# Patient Record
Sex: Male | Born: 1965 | Race: White | Hispanic: No | Marital: Married | Smoking: Never smoker
Health system: Southern US, Community
[De-identification: ages and names within clinical notes are randomized; demographics above are authoritative.]

## PROBLEM LIST (undated history)

## (undated) DIAGNOSIS — E119 Type 2 diabetes mellitus without complications: Secondary | ICD-10-CM

---

## 2016-02-09 DIAGNOSIS — R079 Chest pain, unspecified: Secondary | ICD-10-CM | POA: Diagnosis not present

## 2016-02-09 DIAGNOSIS — I10 Essential (primary) hypertension: Secondary | ICD-10-CM | POA: Diagnosis not present

## 2016-02-09 DIAGNOSIS — I471 Supraventricular tachycardia: Secondary | ICD-10-CM | POA: Diagnosis not present

## 2016-02-12 DIAGNOSIS — R079 Chest pain, unspecified: Secondary | ICD-10-CM | POA: Diagnosis not present

## 2016-02-12 DIAGNOSIS — I480 Paroxysmal atrial fibrillation: Secondary | ICD-10-CM | POA: Diagnosis not present

## 2016-02-12 DIAGNOSIS — E099 Drug or chemical induced diabetes mellitus without complications: Secondary | ICD-10-CM | POA: Diagnosis not present

## 2016-02-12 DIAGNOSIS — T50905A Adverse effect of unspecified drugs, medicaments and biological substances, initial encounter: Secondary | ICD-10-CM | POA: Diagnosis not present

## 2016-02-20 ENCOUNTER — Emergency Department (HOSPITAL_COMMUNITY): Payer: BLUE CROSS/BLUE SHIELD

## 2016-02-20 ENCOUNTER — Encounter (HOSPITAL_COMMUNITY): Payer: Self-pay | Admitting: *Deleted

## 2016-02-20 ENCOUNTER — Emergency Department (HOSPITAL_COMMUNITY)
Admission: EM | Admit: 2016-02-20 | Discharge: 2016-02-20 | Disposition: A | Discharge status: Home / Self Care | Payer: BLUE CROSS/BLUE SHIELD | Attending: Emergency Medicine | Admitting: Emergency Medicine

## 2016-02-20 DIAGNOSIS — R079 Chest pain, unspecified: Secondary | ICD-10-CM | POA: Insufficient documentation

## 2016-02-20 DIAGNOSIS — Z7984 Long term (current) use of oral hypoglycemic drugs: Secondary | ICD-10-CM | POA: Insufficient documentation

## 2016-02-20 DIAGNOSIS — R0602 Shortness of breath: Secondary | ICD-10-CM | POA: Diagnosis not present

## 2016-02-20 DIAGNOSIS — R5381 Other malaise: Secondary | ICD-10-CM | POA: Insufficient documentation

## 2016-02-20 DIAGNOSIS — E119 Type 2 diabetes mellitus without complications: Secondary | ICD-10-CM | POA: Diagnosis not present

## 2016-02-20 DIAGNOSIS — R0789 Other chest pain: Secondary | ICD-10-CM | POA: Diagnosis not present

## 2016-02-20 HISTORY — DX: Type 2 diabetes mellitus without complications: E11.9

## 2016-02-20 LAB — CBC
HCT: 41.6 % (ref 39.0–52.0)
Hemoglobin: 14.2 g/dL (ref 13.0–17.0)
MCH: 29 pg (ref 26.0–34.0)
MCHC: 34.1 g/dL (ref 30.0–36.0)
MCV: 84.9 fL (ref 78.0–100.0)
Platelets: 162 10*3/uL (ref 150–400)
RBC: 4.9 MIL/uL (ref 4.22–5.81)
RDW: 12.5 % (ref 11.5–15.5)
WBC: 5.7 10*3/uL (ref 4.0–10.5)

## 2016-02-20 LAB — I-STAT TROPONIN, ED
Troponin i, poc: 0 ng/mL (ref 0.00–0.08)
Troponin i, poc: 0 ng/mL (ref 0.00–0.08)

## 2016-02-20 LAB — BASIC METABOLIC PANEL
Anion gap: 9 (ref 5–15)
BUN: 19 mg/dL (ref 6–20)
CO2: 25 mmol/L (ref 22–32)
Calcium: 9.3 mg/dL (ref 8.9–10.3)
Chloride: 104 mmol/L (ref 101–111)
Creatinine, Ser: 1 mg/dL (ref 0.61–1.24)
GFR calc Af Amer: >60 mL/min (ref >60)
GFR calc non Af Amer: >60 mL/min (ref >60)
Glucose, Bld: 232 mg/dL — ABNORMAL HIGH (ref 65–99)
Potassium: 4.3 mmol/L (ref 3.5–5.1)
Sodium: 138 mmol/L (ref 135–145)

## 2016-02-20 LAB — CBG MONITORING, ED: Glucose-Capillary: 212 mg/dL — ABNORMAL HIGH (ref 65–99)

## 2016-02-20 NOTE — ED Notes (Signed)
CBG 212 

## 2016-02-20 NOTE — Discharge Instructions (Signed)
Continue all your regular medications. Please follow-up with your doctor for stress test as scheduled. Return if worsening.   Nonspecific Chest Pain  Chest pain can be caused by many different conditions. There is always a chance that your pain could be related to something serious, such as a heart attack or a blood clot in your lungs. Chest pain can also be caused by conditions that are not life-threatening. If you have chest pain, it is very important to follow up with your health care provider. CAUSES  Chest pain can be caused by:  Heartburn.  Pneumonia or bronchitis.  Anxiety or stress.  Inflammation around your heart (pericarditis) or lung (pleuritis or pleurisy).  A blood clot in your lung.  A collapsed lung (pneumothorax). It can develop suddenly on its own (spontaneous pneumothorax) or from trauma to the chest.  Shingles infection (varicella-zoster virus).  Heart attack.  Damage to the bones, muscles, and cartilage that make up your chest wall. This can include:  Bruised bones due to injury.  Strained muscles or cartilage due to frequent or repeated coughing or overwork.  Fracture to one or more ribs.  Sore cartilage due to inflammation (costochondritis). RISK FACTORS  Risk factors for chest pain may include:  Activities that increase your risk for trauma or injury to your chest.  Respiratory infections or conditions that cause frequent coughing.  Medical conditions or overeating that can cause heartburn.  Heart disease or family history of heart disease.  Conditions or health behaviors that increase your risk of developing a blood clot.  Having had chicken pox (varicella zoster). SIGNS AND SYMPTOMS Chest pain can feel like:  Burning or tingling on the surface of your chest or deep in your chest.  Crushing, pressure, aching, or squeezing pain.  Dull or sharp pain that is worse when you move, cough, or take a deep breath.  Pain that is also felt in your  back, neck, shoulder, or arm, or pain that spreads to any of these areas. Your chest pain may come and go, or it may stay constant. DIAGNOSIS Lab tests or other studies may be needed to find the cause of your pain. Your health care provider may have you take a test called an ambulatory ECG (electrocardiogram). An ECG records your heartbeat patterns at the time the test is performed. You may also have other tests, such as:  Transthoracic echocardiogram (TTE). During echocardiography, sound waves are used to create a picture of all of the heart structures and to look at how blood flows through your heart.  Transesophageal echocardiogram (TEE).This is a more advanced imaging test that obtains images from inside your body. It allows your health care provider to see your heart in finer detail.  Cardiac monitoring. This allows your health care provider to monitor your heart rate and rhythm in real time.  Holter monitor. This is a portable device that records your heartbeat and can help to diagnose abnormal heartbeats. It allows your health care provider to track your heart activity for several days, if needed.  Stress tests. These can be done through exercise or by taking medicine that makes your heart beat more quickly.  Blood tests.  Imaging tests. TREATMENT  Your treatment depends on what is causing your chest pain. Treatment may include:  Medicines. These may include:  Acid blockers for heartburn.  Anti-inflammatory medicine.  Pain medicine for inflammatory conditions.  Antibiotic medicine, if an infection is present.  Medicines to dissolve blood clots.  Medicines to treat coronary  artery disease.  Supportive care for conditions that do not require medicines. This may include:  Resting.  Applying heat or cold packs to injured areas.  Limiting activities until pain decreases. HOME CARE INSTRUCTIONS  If you were prescribed an antibiotic medicine, finish it all even if you  start to feel better.  Avoid any activities that bring on chest pain.  Do not use any tobacco products, including cigarettes, chewing tobacco, or electronic cigarettes. If you need help quitting, ask your health care provider.  Do not drink alcohol.  Take medicines only as directed by your health care provider.  Keep all follow-up visits as directed by your health care provider. This is important. This includes any further testing if your chest pain does not go away.  If heartburn is the cause for your chest pain, you may be told to keep your head raised (elevated) while sleeping. This reduces the chance that acid will go from your stomach into your esophagus.  Make lifestyle changes as directed by your health care provider. These may include:  Getting regular exercise. Ask your health care provider to suggest some activities that are safe for you.  Eating a heart-healthy diet. A registered dietitian can help you to learn healthy eating options.  Maintaining a healthy weight.  Managing diabetes, if necessary.  Reducing stress. SEEK MEDICAL CARE IF:  Your chest pain does not go away after treatment.  You have a rash with blisters on your chest.  You have a fever. SEEK IMMEDIATE MEDICAL CARE IF:   Your chest pain is worse.  You have an increasing cough, or you cough up blood.  You have severe abdominal pain.  You have severe weakness.  You faint.  You have chills.  You have sudden, unexplained chest discomfort.  You have sudden, unexplained discomfort in your arms, back, neck, or jaw.  You have shortness of breath at any time.  You suddenly start to sweat, or your skin gets clammy.  You feel nauseous or you vomit.  You suddenly feel light-headed or dizzy.  Your heart begins to beat quickly, or it feels like it is skipping beats. These symptoms may represent a serious problem that is an emergency. Do not wait to see if the symptoms will go away. Get medical  help right away. Call your local emergency services (911 in the [place] Do not drive yourself to the hospital.   This information is not intended to replace advice given to you by your health care provider. Make sure you discuss any questions you have with your health care provider.   Document Released: 08/04/2005 Document Revised: 11/15/2014 Document Reviewed: 05/31/2014 Elsevier Interactive Patient Education [employer].

## 2016-02-20 NOTE — ED Provider Notes (Signed)
CSN: [id number]     Arrival date & time 02/20/16  1405 History   First MD Initiated Contact with Patient 02/20/16 1507     Chief Complaint  Patient presents with  . Chest Pain     (Consider location/radiation/quality/duration/timing/severity/associated sxs/prior Treatment) HPI Kenneth Lynn is a 50 y.o. male with history of diabetes, recently treated A. fib, presents to emergency department complaining of chest tightness and generalized malaise. Patient states that he was diagnosed with diabetes approximately 6 months ago, currently on metformin. He states that about 3 weeks ago he was treated at [facility] for A. fib, states he was converted to sinus rhythm with medications and was discharged home with metoprolol and aspirin 321 mg. He states since his discharge he has been not feeling well. He reports constant chest discomfort that is worse at times. He states it is independent of exertion or eating. It is not associated with different body positions or laying down flat. He denies associated shortness of breath or dizziness. He does state that he has had some night sweats. He states that his discomfort is actually better with working and moving around. He did not take his metformin yesterday and this morning, thinking that maybe that is what is causing his symptoms. He went to work, was not feeling well there and decided to come here. Patient has stress test scheduled in 4 days.  Past Medical History  Diagnosis Date  . Diabetes mellitus without complication (HCC)    History reviewed. No pertinent past surgical history. History reviewed. No pertinent family history. Social History  Substance Use Topics  . Smoking status: Never Smoker   . Smokeless tobacco: None  . Alcohol Use: No    Review of Systems  Constitutional: Negative for fever and chills.  Respiratory: Positive for chest tightness. Negative for cough and shortness of breath.   Cardiovascular: Positive for chest pain.  Negative for palpitations and leg swelling.  Gastrointestinal: Negative for nausea, vomiting, abdominal pain, diarrhea and abdominal distention.  Genitourinary: Negative for urgency.  Musculoskeletal: Negative for myalgias, arthralgias, neck pain and neck stiffness.  Skin: Negative for rash.  Allergic/Immunologic: Negative for immunocompromised state.  Neurological: Negative for dizziness, weakness, light-headedness, numbness and headaches.  All other systems reviewed and are negative.     Allergies  Review of patient's allergies indicates no known allergies.  Home Medications   Prior to Admission medications   Not on File   BP 163/95 mmHg  Pulse 91  Temp(Src) 98.2 F (36.8 C) (Oral)  Resp 17  SpO2 98% Physical Exam  Constitutional: He is oriented to person, place, and time. He appears well-developed and well-nourished. No distress.  HENT:  Head: Normocephalic and atraumatic.  Eyes: Conjunctivae are normal.  Neck: Neck supple.  Cardiovascular: Normal rate, regular rhythm and normal heart sounds.   Pulmonary/Chest: Effort normal. No respiratory distress. He has no wheezes. He has no rales.  Abdominal: Soft. Bowel sounds are normal. He exhibits no distension. There is no tenderness. There is no rebound.  Musculoskeletal: He exhibits no edema.  Neurological: He is alert and oriented to person, place, and time.  Skin: Skin is warm and dry.  Nursing note and vitals reviewed.   ED Course  Procedures (including critical care time) Labs Review Labs Reviewed  BASIC METABOLIC PANEL - Abnormal; Notable for the following:    Glucose, Bld 232 (*)    All other components within normal limits  CBG MONITORING, ED - Abnormal; Notable for the following:  Glucose-Capillary 212 (*)    All other components within normal limits  CBC  I-STAT TROPOININ, ED  I-STAT TROPOININ, ED    Imaging Review Dg Chest 2 View  02/20/2016  CLINICAL DATA:  Mid chest heaviness with diaphoresis and  shortness of breast since last night, history diabetes mellitus, high blood pressure, atrial fibrillation, initial encounter EXAM: CHEST  2 VIEW COMPARISON:  None FINDINGS: Normal heart size, mediastinal contours, and pulmonary vascularity. Lungs clear. No pleural effusion or pneumothorax. Bones unremarkable. IMPRESSION: No acute abnormalities. Electronically Signed   By: Ulyses SouthwardMark  Boles M.D.   On: 02/20/2016 14:47   I have personally reviewed and evaluated these images and lab results as part of my medical decision-making.   EKG Interpretation None      MDM   Final diagnoses:  Chest pain, unspecified chest pain type    Pt is a non toxic appearing [age] male here with constant chest pain for 2 weeks and generalized malaise. He is mildly hypertensive here, no hx of htn. He did not take his metoprolol today. Pt's ECG with no ischemic changes. Initial trop negative. His pain is atypical. Discussed with Dr. Effie ShyWentz. Pt's heart score is 2. Risk factors include family hx of heart attack and diabetes. Pt is considered to be low risk. Will get delta trop. Will monitor mean while.    Delta troponin negative. Patient remains in sinus rhythm. Normal vital signs. He is low risk for coronary disease, and appropriate for outpatient treatment. He has stress test scheduled in 4 days. I had a long discussion with patient, he states he had a very stressful week at work, he reports some anxiety symptoms as well. He also is very concerned about side effects of his medications. I instructed him to talk to his doctor about his medications and see if they want to switch to something else. Otherwise proceed with stress test. Return precautions discussed.  Filed Vitals:   02/20/16 1915 02/20/16 1930 02/20/16 1945 02/20/16 2000  BP: 143/91 134/85 128/83 136/82  Pulse: 72 76 80 78  Temp:      TempSrc:      Resp: 16 17 17 14   SpO2: 96% 98% 96% 98%     Jaynie Crumbleatyana Darnelle Derrick, PA-C 02/20/16 2349  Mancel BaleElliott Wentz, MD 02/21/16  1436

## 2016-02-20 NOTE — ED Notes (Signed)
Pt departed in NAD.  

## 2016-02-20 NOTE — ED Notes (Signed)
Pt reports onset of chest heaviness last night with diaphoresis and mild sob. Recent medication changes and reports being at [facility] on 3/23 and diagnosed with afib.

## 2016-02-25 DIAGNOSIS — R079 Chest pain, unspecified: Secondary | ICD-10-CM | POA: Diagnosis not present

## 2016-02-25 DIAGNOSIS — T50905A Adverse effect of unspecified drugs, medicaments and biological substances, initial encounter: Secondary | ICD-10-CM | POA: Diagnosis not present

## 2016-02-25 DIAGNOSIS — E099 Drug or chemical induced diabetes mellitus without complications: Secondary | ICD-10-CM | POA: Diagnosis not present

## 2016-02-25 DIAGNOSIS — I480 Paroxysmal atrial fibrillation: Secondary | ICD-10-CM | POA: Diagnosis not present

## 2016-04-19 DIAGNOSIS — E1165 Type 2 diabetes mellitus with hyperglycemia: Secondary | ICD-10-CM | POA: Diagnosis not present

## 2016-04-29 DIAGNOSIS — F419 Anxiety disorder, unspecified: Secondary | ICD-10-CM | POA: Diagnosis not present

## 2016-04-29 DIAGNOSIS — E1165 Type 2 diabetes mellitus with hyperglycemia: Secondary | ICD-10-CM | POA: Diagnosis not present

## 2016-04-29 DIAGNOSIS — R0789 Other chest pain: Secondary | ICD-10-CM | POA: Diagnosis not present

## 2016-05-27 DIAGNOSIS — R079 Chest pain, unspecified: Secondary | ICD-10-CM | POA: Diagnosis not present

## 2016-05-27 DIAGNOSIS — I480 Paroxysmal atrial fibrillation: Secondary | ICD-10-CM | POA: Diagnosis not present

## 2016-05-27 DIAGNOSIS — R002 Palpitations: Secondary | ICD-10-CM | POA: Diagnosis not present

## 2016-05-27 DIAGNOSIS — R0789 Other chest pain: Secondary | ICD-10-CM | POA: Diagnosis not present

## 2016-05-27 DIAGNOSIS — E119 Type 2 diabetes mellitus without complications: Secondary | ICD-10-CM | POA: Diagnosis not present

## 2016-05-28 DIAGNOSIS — R0789 Other chest pain: Secondary | ICD-10-CM | POA: Diagnosis not present

## 2016-05-28 DIAGNOSIS — I480 Paroxysmal atrial fibrillation: Secondary | ICD-10-CM | POA: Diagnosis not present

## 2016-05-28 DIAGNOSIS — R079 Chest pain, unspecified: Secondary | ICD-10-CM | POA: Diagnosis not present

## 2016-06-21 DIAGNOSIS — M545 Low back pain: Secondary | ICD-10-CM | POA: Diagnosis not present

## 2016-06-23 DIAGNOSIS — I10 Essential (primary) hypertension: Secondary | ICD-10-CM | POA: Diagnosis not present

## 2016-06-23 DIAGNOSIS — E119 Type 2 diabetes mellitus without complications: Secondary | ICD-10-CM | POA: Diagnosis not present

## 2016-06-23 DIAGNOSIS — I482 Chronic atrial fibrillation: Secondary | ICD-10-CM | POA: Diagnosis not present

## 2016-06-23 DIAGNOSIS — E782 Mixed hyperlipidemia: Secondary | ICD-10-CM | POA: Diagnosis not present

## 2016-07-01 DIAGNOSIS — E668 Other obesity: Secondary | ICD-10-CM | POA: Diagnosis not present

## 2016-07-01 DIAGNOSIS — E119 Type 2 diabetes mellitus without complications: Secondary | ICD-10-CM | POA: Diagnosis not present

## 2016-07-01 DIAGNOSIS — I480 Paroxysmal atrial fibrillation: Secondary | ICD-10-CM | POA: Diagnosis not present

## 2016-07-13 DIAGNOSIS — E119 Type 2 diabetes mellitus without complications: Secondary | ICD-10-CM | POA: Diagnosis not present

## 2016-07-26 ENCOUNTER — Ambulatory Visit (HOSPITAL_BASED_OUTPATIENT_CLINIC_OR_DEPARTMENT_OTHER): Payer: BLUE CROSS/BLUE SHIELD | Attending: Cardiology | Admitting: Internal Medicine

## 2016-07-26 VITALS — Ht 70.0 in | Wt 240.0 lb

## 2016-07-26 DIAGNOSIS — R0683 Snoring: Secondary | ICD-10-CM | POA: Diagnosis not present

## 2016-07-26 DIAGNOSIS — E669 Obesity, unspecified: Secondary | ICD-10-CM | POA: Diagnosis not present

## 2016-07-26 DIAGNOSIS — G4733 Obstructive sleep apnea (adult) (pediatric): Secondary | ICD-10-CM

## 2016-07-26 DIAGNOSIS — I4891 Unspecified atrial fibrillation: Secondary | ICD-10-CM | POA: Diagnosis not present

## 2016-07-26 DIAGNOSIS — Z6834 Body mass index (BMI) 34.0-34.9, adult: Secondary | ICD-10-CM | POA: Diagnosis not present

## 2016-07-26 DIAGNOSIS — E119 Type 2 diabetes mellitus without complications: Secondary | ICD-10-CM | POA: Diagnosis not present

## 2016-07-27 ENCOUNTER — Other Ambulatory Visit (HOSPITAL_BASED_OUTPATIENT_CLINIC_OR_DEPARTMENT_OTHER): Payer: Self-pay

## 2016-07-27 DIAGNOSIS — G4733 Obstructive sleep apnea (adult) (pediatric): Secondary | ICD-10-CM

## 2016-08-07 DIAGNOSIS — G4733 Obstructive sleep apnea (adult) (pediatric): Secondary | ICD-10-CM | POA: Diagnosis not present

## 2016-08-07 NOTE — Procedures (Signed)
   Patient Name: Kenneth Lynn, Kenneth Lynn Study Date: 07/26/2016 Gender: Male D.O.B: 1966/05/22 Age (years): 50 Referring Provider: Ellwood HandlerWilliam Tilley Height (inches): 70 Interpreting Physician: Kenneth Duhamellinton Symphani Eckstrom MD, ABSM Weight (lbs): 240 RPSGT: Kenneth Lynn, Kenneth Lynn BMI: 34 MRN: [id number] Neck Size: 17.50 CLINICAL INFORMATION Sleep Study Type: NPSG Indication for sleep study: Obesity Epworth Sleepiness Score: 5 SLEEP STUDY TECHNIQUE As per the AASM Manual for the Scoring of Sleep and Associated Events v2.3 (April 2016) with a hypopnea requiring 4% desaturations. The channels recorded and monitored were frontal, central and occipital EEG, electrooculogram (EOG), submentalis EMG (chin), nasal and oral airflow, thoracic and abdominal wall motion, anterior tibialis EMG, snore microphone, electrocardiogram, and pulse oximetry.  MEDICATIONS Patient's medications include: charted for review. Medications self-administered by patient during sleep study : No sleep medicine administered.  SLEEP ARCHITECTURE The study was initiated at 10:09:59 PM and ended at 4:15:46 AM. Sleep onset time was 42.7 minutes and the sleep efficiency was 57.0%. The total sleep time was 208.5 minutes. Stage REM latency was 231.5 minutes. The patient spent 11.51% of the night in stage N1 sleep, 82.48% in stage N2 sleep, 0.00% in stage N3 and 6.01% in REM. Alpha intrusion was absent. Supine sleep was 0.00%. Wake after sleep onset 114 minutes  RESPIRATORY PARAMETERS The overall apnea/hypopnea index (AHI) was 18.4 per hour. There were 63 total apneas, including 59 obstructive, 3 central and 1 mixed apneas. There were 1 hypopneas and 16 RERAs. The AHI during Stage REM sleep was 14.4 per hour. AHI while supine was N/A per hour. The mean oxygen saturation was 93.29%. The minimum SpO2 during sleep was 87.00%. Loud snoring was noted during this study.  CARDIAC DATA The 2 lead EKG demonstrated sinus rhythm. The mean heart rate was 64.18  beats per minute. Other EKG findings include: None.  LEG MOVEMENT DATA The total PLMS were 0 with a resulting PLMS index of 0.00. Associated arousal with leg movement index was 0.0 .  IMPRESSIONS - Moderate obstructive sleep apnea occurred during this study (AHI = 18.4/h). - Difficulty initiating and maintaining sleep, with insufficient early sleep to meet protocol requirements for split CPAP titration. - No significant central sleep apnea occurred during this study (CAI = 0.9/h). - Mild oxygen desaturation was noted during this study (Min O2 = 87.00%). - The patient snored with Loud snoring volume. - No cardiac abnormalities were noted during this study. - Clinically significant periodic limb movements did not occur during sleep. No significant associated arousals.  DIAGNOSIS - Obstructive Sleep Apnea (327.23 [G47.33 ICD-10]  RECOMMENDATIONS - Therapeutic CPAP titration to determine optimal pressure required to alleviate sleep disordered breathing. - Avoid alcohol, sedatives and other CNS depressants that may worsen sleep apnea and disrupt normal sleep architecture. - Sleep hygiene should be reviewed to assess factors that may improve sleep quality. - Weight management and regular exercise should be initiated or continued if appropriate.  [Electronically signed] 08/07/2016 09:01 AM  Kenneth Duhamellinton Kenneth Bonus MD, ABSM Diplomate, American Board of Sleep Medicine   NPI: [id number]  Waymon BudgeYOUNG,Jennavie Martinek D Diplomate, American Board of Sleep Medicine  ELECTRONICALLY SIGNED ON:  08/07/2016, 8:55 AM [facility] PH: [phone]   FX: [phone] ACCREDITED BY THE AMERICAN ACADEMY OF SLEEP MEDICINE

## 2016-08-12 DIAGNOSIS — Z23 Encounter for immunization: Secondary | ICD-10-CM | POA: Diagnosis not present

## 2016-09-01 DIAGNOSIS — E119 Type 2 diabetes mellitus without complications: Secondary | ICD-10-CM | POA: Diagnosis not present

## 2016-10-05 DIAGNOSIS — R1013 Epigastric pain: Secondary | ICD-10-CM | POA: Diagnosis not present

## 2016-10-05 DIAGNOSIS — R109 Unspecified abdominal pain: Secondary | ICD-10-CM | POA: Diagnosis not present

## 2016-10-19 DIAGNOSIS — I4891 Unspecified atrial fibrillation: Secondary | ICD-10-CM | POA: Diagnosis not present

## 2016-10-19 DIAGNOSIS — E119 Type 2 diabetes mellitus without complications: Secondary | ICD-10-CM | POA: Diagnosis not present

## 2016-10-19 DIAGNOSIS — Z1389 Encounter for screening for other disorder: Secondary | ICD-10-CM | POA: Diagnosis not present

## 2016-10-25 IMAGING — DX DG CHEST 2V
2 series · 2 of 2 positions shown · non-contrast
Comparison: None

CLINICAL DATA: Mid chest heaviness with diaphoresis and shortness
of breast since last night, history diabetes mellitus, high blood
pressure, atrial fibrillation, initial encounter

EXAM:
CHEST  2 VIEW

[chest pa]
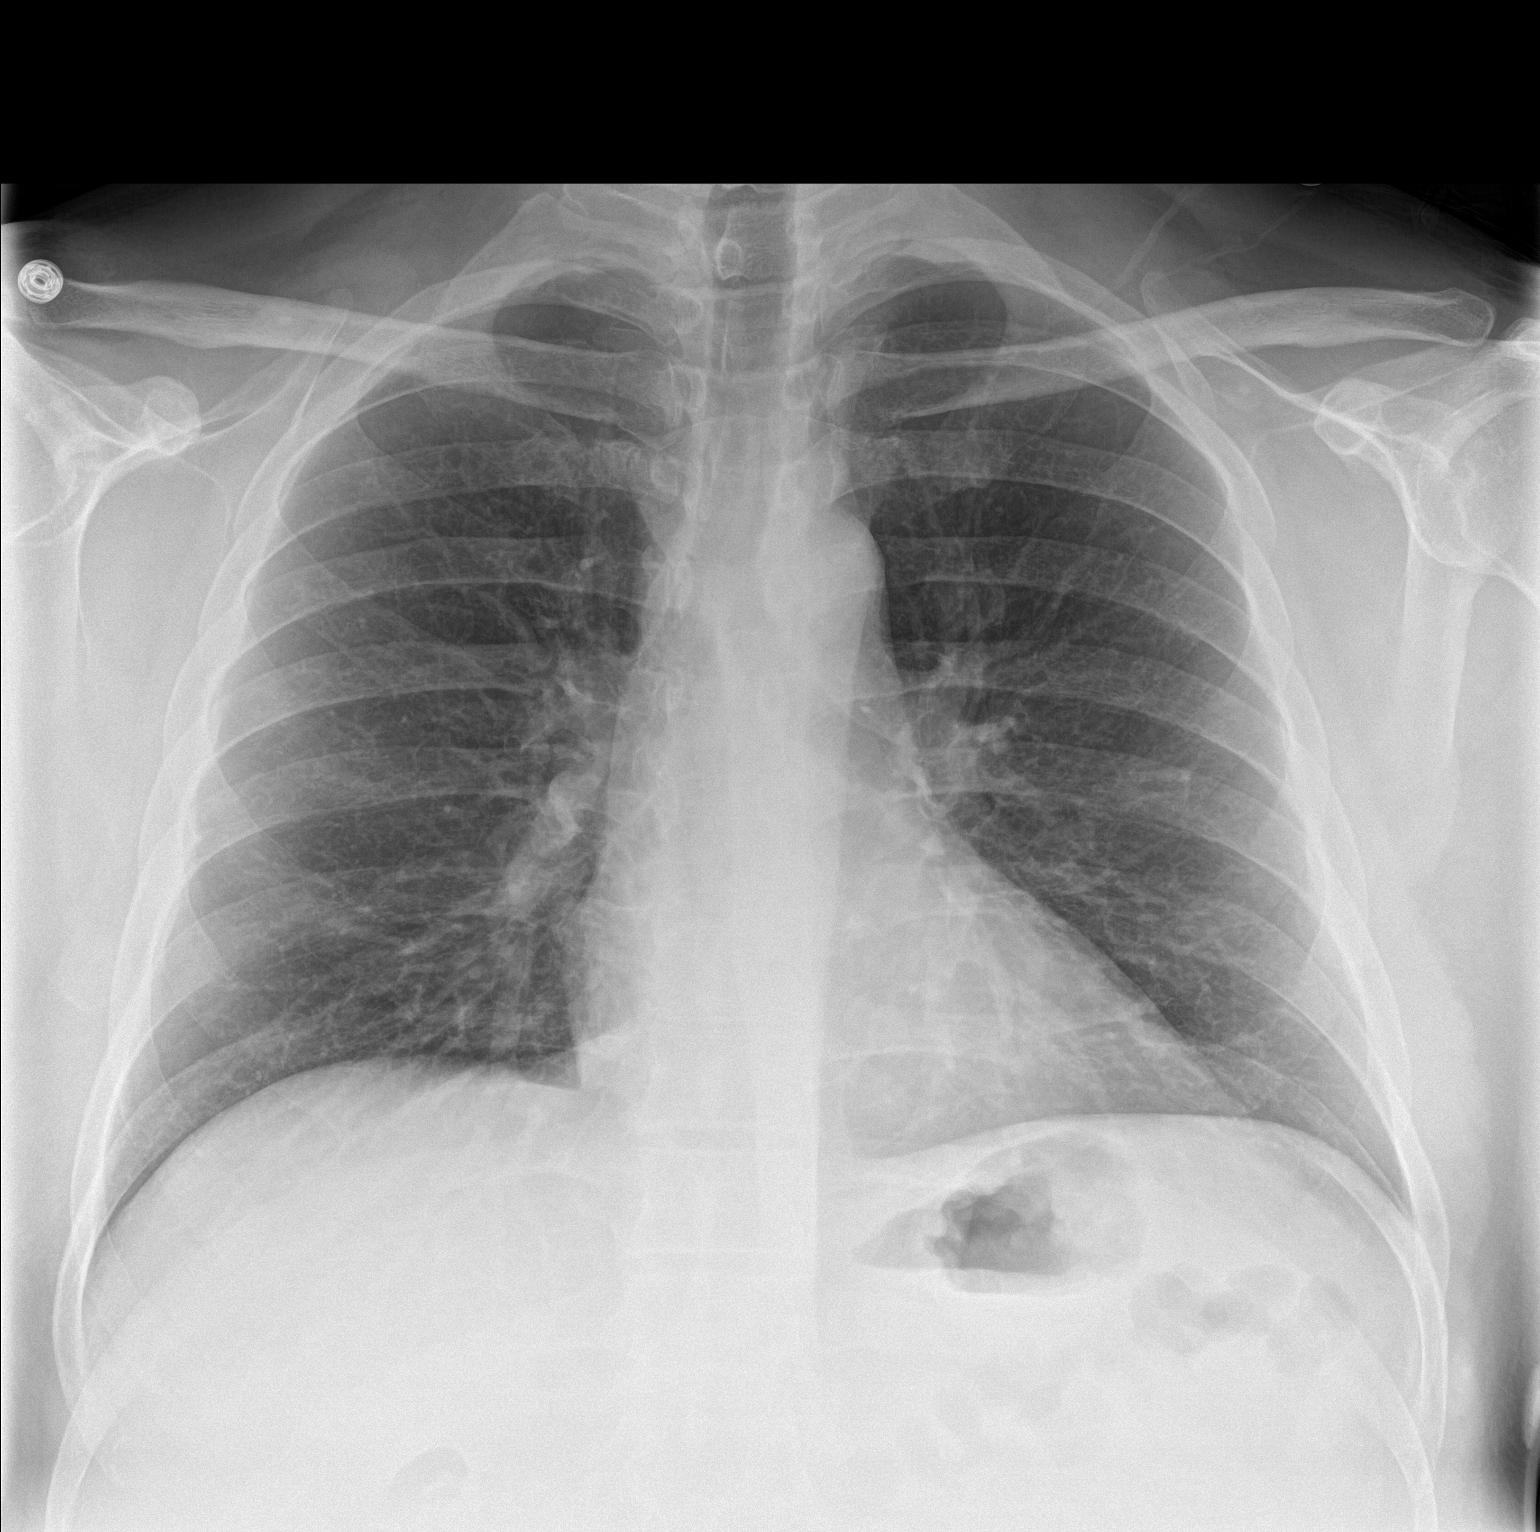

[chest lat]
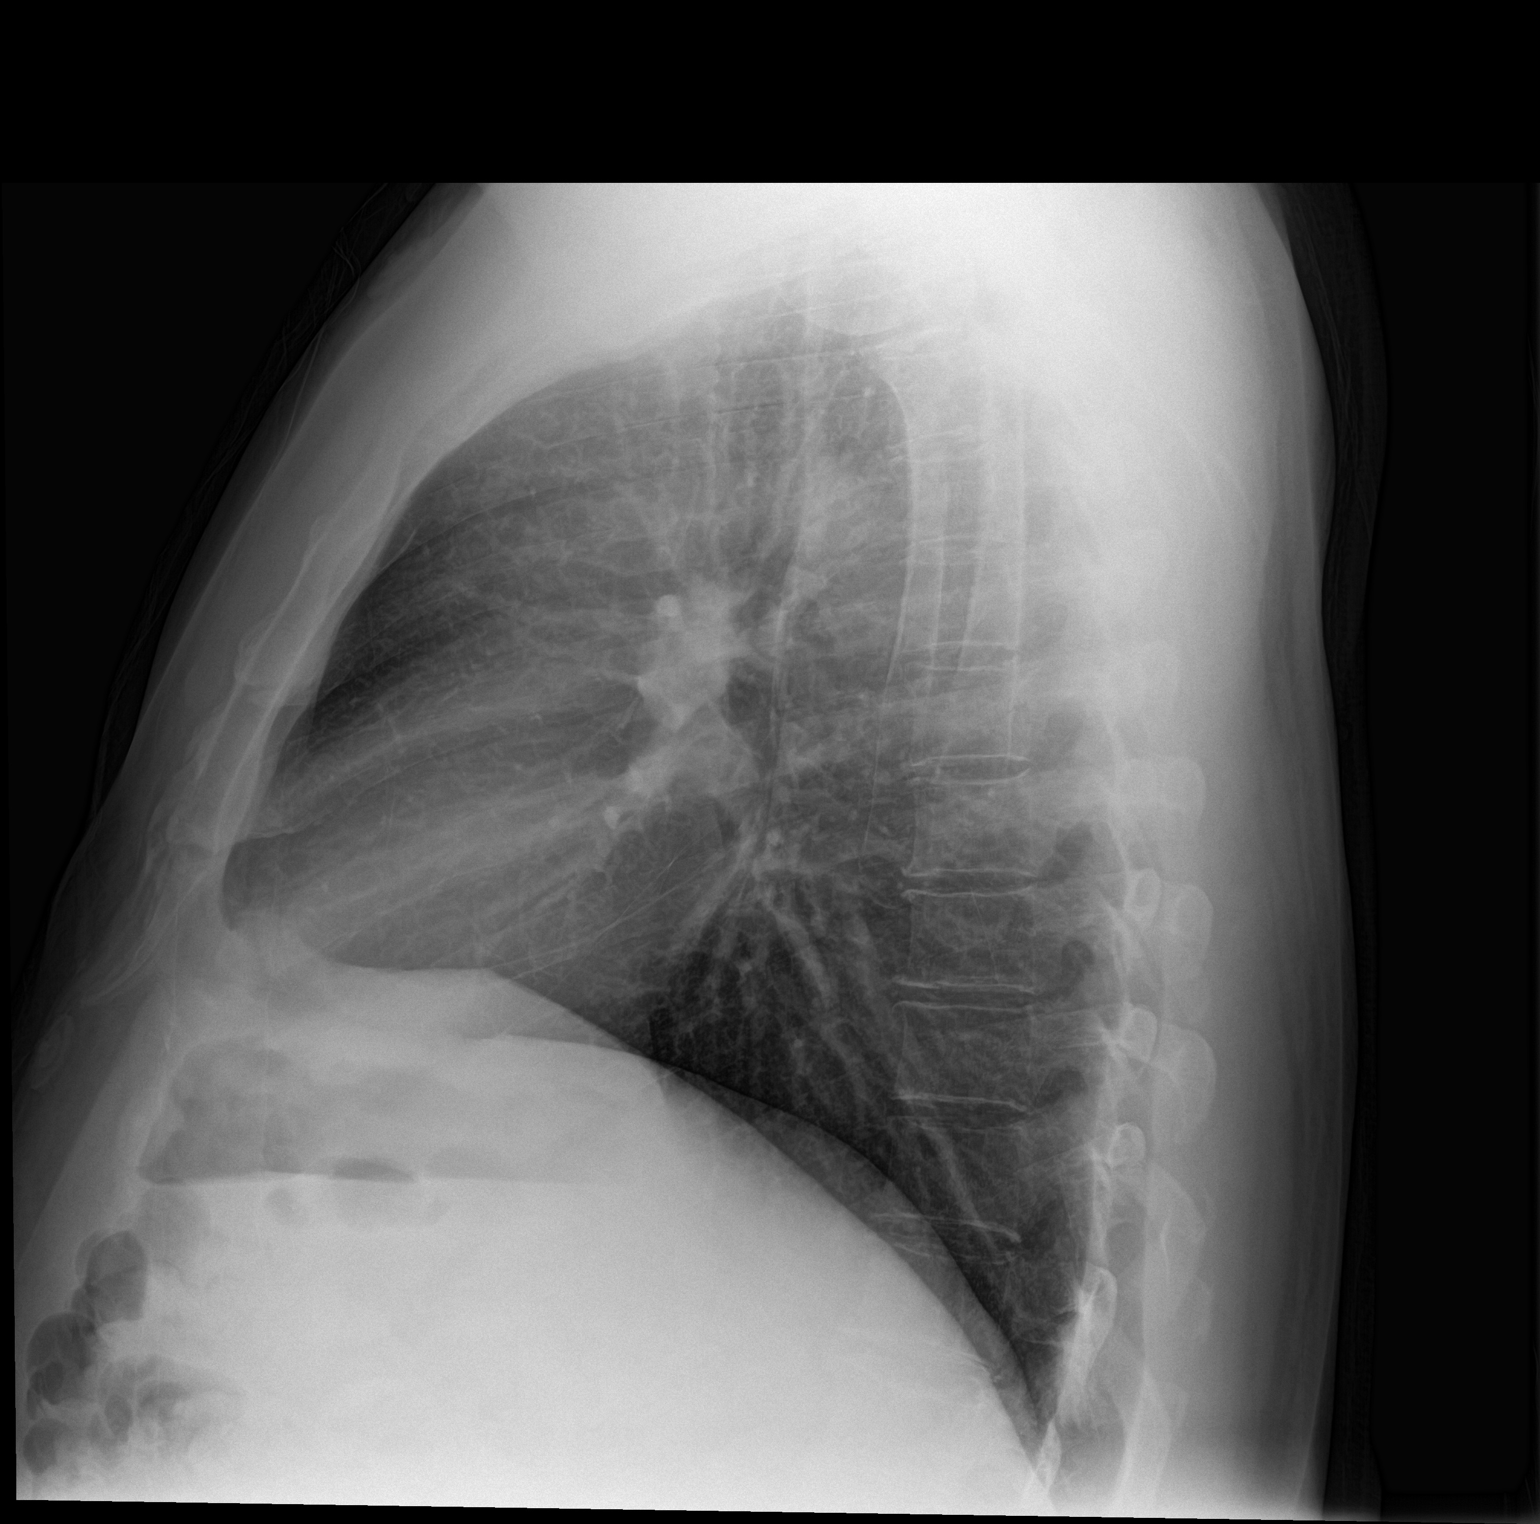

[2 of 2 positions shown; findings below may reference images not displayed]

FINDINGS: Normal heart size, mediastinal contours, and pulmonary vascularity.

Lungs clear.

No pleural effusion or pneumothorax.

Bones unremarkable.
IMPRESSION: No acute abnormalities.

## 2016-12-23 DIAGNOSIS — H6123 Impacted cerumen, bilateral: Secondary | ICD-10-CM | POA: Diagnosis not present

## 2016-12-23 DIAGNOSIS — Z6835 Body mass index (BMI) 35.0-35.9, adult: Secondary | ICD-10-CM | POA: Diagnosis not present

## 2016-12-23 DIAGNOSIS — H6692 Otitis media, unspecified, left ear: Secondary | ICD-10-CM | POA: Diagnosis not present

## 2016-12-23 DIAGNOSIS — H9202 Otalgia, left ear: Secondary | ICD-10-CM | POA: Diagnosis not present

## 2017-01-04 DIAGNOSIS — G4733 Obstructive sleep apnea (adult) (pediatric): Secondary | ICD-10-CM | POA: Diagnosis not present

## 2017-01-04 DIAGNOSIS — I480 Paroxysmal atrial fibrillation: Secondary | ICD-10-CM | POA: Diagnosis not present

## 2017-01-04 DIAGNOSIS — R030 Elevated blood-pressure reading, without diagnosis of hypertension: Secondary | ICD-10-CM | POA: Diagnosis not present

## 2017-01-04 DIAGNOSIS — E668 Other obesity: Secondary | ICD-10-CM | POA: Diagnosis not present

## 2017-01-19 DIAGNOSIS — I10 Essential (primary) hypertension: Secondary | ICD-10-CM | POA: Diagnosis not present

## 2017-01-19 DIAGNOSIS — Z6834 Body mass index (BMI) 34.0-34.9, adult: Secondary | ICD-10-CM | POA: Diagnosis not present

## 2017-01-19 DIAGNOSIS — K219 Gastro-esophageal reflux disease without esophagitis: Secondary | ICD-10-CM | POA: Diagnosis not present

## 2017-01-26 DIAGNOSIS — E119 Type 2 diabetes mellitus without complications: Secondary | ICD-10-CM | POA: Diagnosis not present

## 2017-01-26 DIAGNOSIS — I480 Paroxysmal atrial fibrillation: Secondary | ICD-10-CM | POA: Diagnosis not present

## 2017-01-26 DIAGNOSIS — I10 Essential (primary) hypertension: Secondary | ICD-10-CM | POA: Diagnosis not present

## 2017-01-26 DIAGNOSIS — K219 Gastro-esophageal reflux disease without esophagitis: Secondary | ICD-10-CM | POA: Diagnosis not present

## 2017-04-07 DIAGNOSIS — I480 Paroxysmal atrial fibrillation: Secondary | ICD-10-CM | POA: Diagnosis not present

## 2017-04-07 DIAGNOSIS — E668 Other obesity: Secondary | ICD-10-CM | POA: Diagnosis not present

## 2017-04-07 DIAGNOSIS — R030 Elevated blood-pressure reading, without diagnosis of hypertension: Secondary | ICD-10-CM | POA: Diagnosis not present

## 2017-05-17 DIAGNOSIS — Z1211 Encounter for screening for malignant neoplasm of colon: Secondary | ICD-10-CM | POA: Diagnosis not present

## 2017-05-17 DIAGNOSIS — R1013 Epigastric pain: Secondary | ICD-10-CM | POA: Diagnosis not present

## 2017-05-17 DIAGNOSIS — K625 Hemorrhage of anus and rectum: Secondary | ICD-10-CM | POA: Diagnosis not present

## 2017-05-24 DIAGNOSIS — E119 Type 2 diabetes mellitus without complications: Secondary | ICD-10-CM | POA: Diagnosis not present

## 2017-05-24 DIAGNOSIS — K219 Gastro-esophageal reflux disease without esophagitis: Secondary | ICD-10-CM | POA: Diagnosis not present

## 2017-05-24 DIAGNOSIS — I10 Essential (primary) hypertension: Secondary | ICD-10-CM | POA: Diagnosis not present

## 2017-05-24 DIAGNOSIS — E785 Hyperlipidemia, unspecified: Secondary | ICD-10-CM | POA: Diagnosis not present

## 2017-06-09 DIAGNOSIS — Z01818 Encounter for other preprocedural examination: Secondary | ICD-10-CM | POA: Diagnosis not present

## 2017-06-09 DIAGNOSIS — Z1211 Encounter for screening for malignant neoplasm of colon: Secondary | ICD-10-CM | POA: Diagnosis not present

## 2017-07-14 DIAGNOSIS — G44209 Tension-type headache, unspecified, not intractable: Secondary | ICD-10-CM | POA: Diagnosis not present

## 2017-07-14 DIAGNOSIS — Z6834 Body mass index (BMI) 34.0-34.9, adult: Secondary | ICD-10-CM | POA: Diagnosis not present

## 2017-07-14 DIAGNOSIS — I10 Essential (primary) hypertension: Secondary | ICD-10-CM | POA: Diagnosis not present

## 2017-07-19 DIAGNOSIS — R51 Headache: Secondary | ICD-10-CM | POA: Diagnosis not present

## 2017-07-30 DIAGNOSIS — D485 Neoplasm of uncertain behavior of skin: Secondary | ICD-10-CM | POA: Diagnosis not present

## 2017-07-30 DIAGNOSIS — D235 Other benign neoplasm of skin of trunk: Secondary | ICD-10-CM | POA: Diagnosis not present

## 2017-07-30 DIAGNOSIS — D1801 Hemangioma of skin and subcutaneous tissue: Secondary | ICD-10-CM | POA: Diagnosis not present

## 2017-08-04 DIAGNOSIS — Z6834 Body mass index (BMI) 34.0-34.9, adult: Secondary | ICD-10-CM | POA: Diagnosis not present

## 2017-08-04 DIAGNOSIS — G44209 Tension-type headache, unspecified, not intractable: Secondary | ICD-10-CM | POA: Diagnosis not present

## 2017-08-04 DIAGNOSIS — I10 Essential (primary) hypertension: Secondary | ICD-10-CM | POA: Diagnosis not present

## 2017-08-12 DIAGNOSIS — D126 Benign neoplasm of colon, unspecified: Secondary | ICD-10-CM | POA: Diagnosis not present

## 2017-08-12 DIAGNOSIS — R140 Abdominal distension (gaseous): Secondary | ICD-10-CM | POA: Diagnosis not present

## 2017-08-12 DIAGNOSIS — Z1211 Encounter for screening for malignant neoplasm of colon: Secondary | ICD-10-CM | POA: Diagnosis not present

## 2017-08-12 DIAGNOSIS — K635 Polyp of colon: Secondary | ICD-10-CM | POA: Diagnosis not present

## 2017-08-12 DIAGNOSIS — K5730 Diverticulosis of large intestine without perforation or abscess without bleeding: Secondary | ICD-10-CM | POA: Diagnosis not present

## 2017-08-12 DIAGNOSIS — K2950 Unspecified chronic gastritis without bleeding: Secondary | ICD-10-CM | POA: Diagnosis not present

## 2017-08-12 DIAGNOSIS — K648 Other hemorrhoids: Secondary | ICD-10-CM | POA: Diagnosis not present

## 2017-08-12 DIAGNOSIS — B9681 Helicobacter pylori [H. pylori] as the cause of diseases classified elsewhere: Secondary | ICD-10-CM | POA: Diagnosis not present

## 2017-08-12 DIAGNOSIS — Z13810 Encounter for screening for upper gastrointestinal disorder: Secondary | ICD-10-CM | POA: Diagnosis not present

## 2017-08-12 DIAGNOSIS — K644 Residual hemorrhoidal skin tags: Secondary | ICD-10-CM | POA: Diagnosis not present

## 2017-08-12 DIAGNOSIS — K2930 Chronic superficial gastritis without bleeding: Secondary | ICD-10-CM | POA: Diagnosis not present

## 2017-08-17 DIAGNOSIS — Z1211 Encounter for screening for malignant neoplasm of colon: Secondary | ICD-10-CM | POA: Diagnosis not present

## 2017-08-17 DIAGNOSIS — B9681 Helicobacter pylori [H. pylori] as the cause of diseases classified elsewhere: Secondary | ICD-10-CM | POA: Diagnosis not present

## 2017-08-17 DIAGNOSIS — K2950 Unspecified chronic gastritis without bleeding: Secondary | ICD-10-CM | POA: Diagnosis not present

## 2017-08-17 DIAGNOSIS — D126 Benign neoplasm of colon, unspecified: Secondary | ICD-10-CM | POA: Diagnosis not present

## 2017-08-18 DIAGNOSIS — Z23 Encounter for immunization: Secondary | ICD-10-CM | POA: Diagnosis not present

## 2017-09-06 DIAGNOSIS — I10 Essential (primary) hypertension: Secondary | ICD-10-CM | POA: Diagnosis not present

## 2017-09-06 DIAGNOSIS — E669 Obesity, unspecified: Secondary | ICD-10-CM | POA: Diagnosis not present

## 2017-09-06 DIAGNOSIS — E785 Hyperlipidemia, unspecified: Secondary | ICD-10-CM | POA: Diagnosis not present

## 2017-09-06 DIAGNOSIS — E119 Type 2 diabetes mellitus without complications: Secondary | ICD-10-CM | POA: Diagnosis not present

## 2017-09-06 DIAGNOSIS — I480 Paroxysmal atrial fibrillation: Secondary | ICD-10-CM | POA: Diagnosis not present

## 2017-10-07 DIAGNOSIS — G44209 Tension-type headache, unspecified, not intractable: Secondary | ICD-10-CM | POA: Diagnosis not present

## 2017-10-07 DIAGNOSIS — R51 Headache: Secondary | ICD-10-CM | POA: Diagnosis not present

## 2017-10-26 DIAGNOSIS — A048 Other specified bacterial intestinal infections: Secondary | ICD-10-CM | POA: Diagnosis not present

## 2017-12-19 DIAGNOSIS — E119 Type 2 diabetes mellitus without complications: Secondary | ICD-10-CM | POA: Diagnosis not present

## 2017-12-19 DIAGNOSIS — Z1331 Encounter for screening for depression: Secondary | ICD-10-CM | POA: Diagnosis not present

## 2017-12-19 DIAGNOSIS — E785 Hyperlipidemia, unspecified: Secondary | ICD-10-CM | POA: Diagnosis not present

## 2017-12-19 DIAGNOSIS — I480 Paroxysmal atrial fibrillation: Secondary | ICD-10-CM | POA: Diagnosis not present

## 2017-12-19 DIAGNOSIS — R51 Headache: Secondary | ICD-10-CM | POA: Diagnosis not present

## 2018-01-03 DIAGNOSIS — K3189 Other diseases of stomach and duodenum: Secondary | ICD-10-CM | POA: Diagnosis not present

## 2018-01-03 DIAGNOSIS — Z86010 Personal history of colonic polyps: Secondary | ICD-10-CM | POA: Diagnosis not present

## 2018-01-03 DIAGNOSIS — Z8719 Personal history of other diseases of the digestive system: Secondary | ICD-10-CM | POA: Diagnosis not present

## 2018-01-03 DIAGNOSIS — A048 Other specified bacterial intestinal infections: Secondary | ICD-10-CM | POA: Diagnosis not present

## 2018-02-06 DIAGNOSIS — A048 Other specified bacterial intestinal infections: Secondary | ICD-10-CM | POA: Diagnosis not present

## 2018-03-21 DIAGNOSIS — I480 Paroxysmal atrial fibrillation: Secondary | ICD-10-CM | POA: Diagnosis not present

## 2018-03-21 DIAGNOSIS — E785 Hyperlipidemia, unspecified: Secondary | ICD-10-CM | POA: Diagnosis not present

## 2018-03-21 DIAGNOSIS — E119 Type 2 diabetes mellitus without complications: Secondary | ICD-10-CM | POA: Diagnosis not present

## 2018-03-21 DIAGNOSIS — Z6834 Body mass index (BMI) 34.0-34.9, adult: Secondary | ICD-10-CM | POA: Diagnosis not present

## 2018-06-23 DIAGNOSIS — E119 Type 2 diabetes mellitus without complications: Secondary | ICD-10-CM | POA: Diagnosis not present

## 2018-06-23 DIAGNOSIS — Z6834 Body mass index (BMI) 34.0-34.9, adult: Secondary | ICD-10-CM | POA: Diagnosis not present

## 2018-06-23 DIAGNOSIS — Z0000 Encounter for general adult medical examination without abnormal findings: Secondary | ICD-10-CM | POA: Diagnosis not present

## 2018-07-18 DIAGNOSIS — D225 Melanocytic nevi of trunk: Secondary | ICD-10-CM | POA: Diagnosis not present

## 2018-07-18 DIAGNOSIS — D485 Neoplasm of uncertain behavior of skin: Secondary | ICD-10-CM | POA: Diagnosis not present

## 2018-07-18 DIAGNOSIS — D2239 Melanocytic nevi of other parts of face: Secondary | ICD-10-CM | POA: Diagnosis not present

## 2018-07-18 DIAGNOSIS — L814 Other melanin hyperpigmentation: Secondary | ICD-10-CM | POA: Diagnosis not present

## 2018-08-15 DIAGNOSIS — Z23 Encounter for immunization: Secondary | ICD-10-CM | POA: Diagnosis not present

## 2018-08-21 DIAGNOSIS — K3189 Other diseases of stomach and duodenum: Secondary | ICD-10-CM | POA: Diagnosis not present

## 2018-08-21 DIAGNOSIS — Z86010 Personal history of colonic polyps: Secondary | ICD-10-CM | POA: Diagnosis not present

## 2018-08-21 DIAGNOSIS — K219 Gastro-esophageal reflux disease without esophagitis: Secondary | ICD-10-CM | POA: Diagnosis not present

## 2018-10-23 DIAGNOSIS — K219 Gastro-esophageal reflux disease without esophagitis: Secondary | ICD-10-CM | POA: Diagnosis not present

## 2018-10-23 DIAGNOSIS — I480 Paroxysmal atrial fibrillation: Secondary | ICD-10-CM | POA: Diagnosis not present

## 2018-10-23 DIAGNOSIS — E119 Type 2 diabetes mellitus without complications: Secondary | ICD-10-CM | POA: Diagnosis not present

## 2018-10-23 DIAGNOSIS — E785 Hyperlipidemia, unspecified: Secondary | ICD-10-CM | POA: Diagnosis not present

## 2019-01-08 DIAGNOSIS — K649 Unspecified hemorrhoids: Secondary | ICD-10-CM | POA: Diagnosis not present

## 2019-01-08 DIAGNOSIS — K3189 Other diseases of stomach and duodenum: Secondary | ICD-10-CM | POA: Diagnosis not present

## 2019-01-08 DIAGNOSIS — R140 Abdominal distension (gaseous): Secondary | ICD-10-CM | POA: Diagnosis not present

## 2019-02-23 DIAGNOSIS — K219 Gastro-esophageal reflux disease without esophagitis: Secondary | ICD-10-CM | POA: Diagnosis not present

## 2019-02-23 DIAGNOSIS — K3189 Other diseases of stomach and duodenum: Secondary | ICD-10-CM | POA: Diagnosis not present

## 2019-02-23 DIAGNOSIS — R140 Abdominal distension (gaseous): Secondary | ICD-10-CM | POA: Diagnosis not present

## 2019-03-02 DIAGNOSIS — R140 Abdominal distension (gaseous): Secondary | ICD-10-CM | POA: Diagnosis not present

## 2019-04-23 DIAGNOSIS — E785 Hyperlipidemia, unspecified: Secondary | ICD-10-CM | POA: Diagnosis not present

## 2019-04-23 DIAGNOSIS — I480 Paroxysmal atrial fibrillation: Secondary | ICD-10-CM | POA: Diagnosis not present

## 2019-04-23 DIAGNOSIS — Z1331 Encounter for screening for depression: Secondary | ICD-10-CM | POA: Diagnosis not present

## 2019-04-23 DIAGNOSIS — E119 Type 2 diabetes mellitus without complications: Secondary | ICD-10-CM | POA: Diagnosis not present

## 2019-04-23 DIAGNOSIS — K219 Gastro-esophageal reflux disease without esophagitis: Secondary | ICD-10-CM | POA: Diagnosis not present

## 2019-06-15 DIAGNOSIS — R51 Headache: Secondary | ICD-10-CM | POA: Diagnosis not present

## 2019-06-15 DIAGNOSIS — R079 Chest pain, unspecified: Secondary | ICD-10-CM | POA: Diagnosis not present

## 2019-06-15 DIAGNOSIS — K219 Gastro-esophageal reflux disease without esophagitis: Secondary | ICD-10-CM | POA: Diagnosis not present

## 2019-06-15 DIAGNOSIS — Z6835 Body mass index (BMI) 35.0-35.9, adult: Secondary | ICD-10-CM | POA: Diagnosis not present

## 2019-07-19 DIAGNOSIS — D2239 Melanocytic nevi of other parts of face: Secondary | ICD-10-CM | POA: Diagnosis not present

## 2019-07-19 DIAGNOSIS — D225 Melanocytic nevi of trunk: Secondary | ICD-10-CM | POA: Diagnosis not present

## 2019-07-19 DIAGNOSIS — D1801 Hemangioma of skin and subcutaneous tissue: Secondary | ICD-10-CM | POA: Diagnosis not present

## 2019-07-19 DIAGNOSIS — L814 Other melanin hyperpigmentation: Secondary | ICD-10-CM | POA: Diagnosis not present

## 2019-08-16 DIAGNOSIS — Z23 Encounter for immunization: Secondary | ICD-10-CM | POA: Diagnosis not present

## 2019-08-23 DIAGNOSIS — R519 Headache, unspecified: Secondary | ICD-10-CM | POA: Diagnosis not present

## 2019-08-23 DIAGNOSIS — E1165 Type 2 diabetes mellitus with hyperglycemia: Secondary | ICD-10-CM | POA: Diagnosis not present

## 2019-08-23 DIAGNOSIS — E785 Hyperlipidemia, unspecified: Secondary | ICD-10-CM | POA: Diagnosis not present

## 2019-08-23 DIAGNOSIS — R079 Chest pain, unspecified: Secondary | ICD-10-CM | POA: Diagnosis not present

## 2019-12-03 DIAGNOSIS — Z1212 Encounter for screening for malignant neoplasm of rectum: Secondary | ICD-10-CM | POA: Diagnosis not present

## 2019-12-03 DIAGNOSIS — E1165 Type 2 diabetes mellitus with hyperglycemia: Secondary | ICD-10-CM | POA: Diagnosis not present

## 2019-12-03 DIAGNOSIS — Z0000 Encounter for general adult medical examination without abnormal findings: Secondary | ICD-10-CM | POA: Diagnosis not present

## 2019-12-03 DIAGNOSIS — Z6835 Body mass index (BMI) 35.0-35.9, adult: Secondary | ICD-10-CM | POA: Diagnosis not present

## 2020-03-03 DIAGNOSIS — R519 Headache, unspecified: Secondary | ICD-10-CM | POA: Diagnosis not present

## 2020-03-03 DIAGNOSIS — E785 Hyperlipidemia, unspecified: Secondary | ICD-10-CM | POA: Diagnosis not present

## 2020-03-03 DIAGNOSIS — I10 Essential (primary) hypertension: Secondary | ICD-10-CM | POA: Diagnosis not present

## 2020-03-03 DIAGNOSIS — E1165 Type 2 diabetes mellitus with hyperglycemia: Secondary | ICD-10-CM | POA: Diagnosis not present

## 2020-03-10 DIAGNOSIS — L918 Other hypertrophic disorders of the skin: Secondary | ICD-10-CM | POA: Diagnosis not present

## 2020-03-10 DIAGNOSIS — L821 Other seborrheic keratosis: Secondary | ICD-10-CM | POA: Diagnosis not present

## 2020-04-09 DIAGNOSIS — L918 Other hypertrophic disorders of the skin: Secondary | ICD-10-CM | POA: Diagnosis not present

## 2020-06-02 DIAGNOSIS — E785 Hyperlipidemia, unspecified: Secondary | ICD-10-CM | POA: Diagnosis not present

## 2020-06-02 DIAGNOSIS — E1165 Type 2 diabetes mellitus with hyperglycemia: Secondary | ICD-10-CM | POA: Diagnosis not present

## 2020-06-02 DIAGNOSIS — R519 Headache, unspecified: Secondary | ICD-10-CM | POA: Diagnosis not present

## 2020-06-02 DIAGNOSIS — I10 Essential (primary) hypertension: Secondary | ICD-10-CM | POA: Diagnosis not present

## 2020-08-04 DIAGNOSIS — L918 Other hypertrophic disorders of the skin: Secondary | ICD-10-CM | POA: Diagnosis not present

## 2020-08-04 DIAGNOSIS — D225 Melanocytic nevi of trunk: Secondary | ICD-10-CM | POA: Diagnosis not present

## 2020-08-04 DIAGNOSIS — D1801 Hemangioma of skin and subcutaneous tissue: Secondary | ICD-10-CM | POA: Diagnosis not present

## 2020-08-04 DIAGNOSIS — D2239 Melanocytic nevi of other parts of face: Secondary | ICD-10-CM | POA: Diagnosis not present

## 2020-08-04 DIAGNOSIS — L814 Other melanin hyperpigmentation: Secondary | ICD-10-CM | POA: Diagnosis not present

## 2020-10-13 DIAGNOSIS — E1165 Type 2 diabetes mellitus with hyperglycemia: Secondary | ICD-10-CM | POA: Diagnosis not present

## 2020-10-13 DIAGNOSIS — R519 Headache, unspecified: Secondary | ICD-10-CM | POA: Diagnosis not present

## 2020-10-13 DIAGNOSIS — E785 Hyperlipidemia, unspecified: Secondary | ICD-10-CM | POA: Diagnosis not present

## 2020-10-13 DIAGNOSIS — I10 Essential (primary) hypertension: Secondary | ICD-10-CM | POA: Diagnosis not present

## 2020-10-13 DIAGNOSIS — Z23 Encounter for immunization: Secondary | ICD-10-CM | POA: Diagnosis not present

## 2020-10-27 DIAGNOSIS — Z20822 Contact with and (suspected) exposure to covid-19: Secondary | ICD-10-CM | POA: Diagnosis not present

## 2020-11-03 DIAGNOSIS — U071 COVID-19: Secondary | ICD-10-CM | POA: Diagnosis not present

## 2020-12-29 DIAGNOSIS — Z1212 Encounter for screening for malignant neoplasm of rectum: Secondary | ICD-10-CM | POA: Diagnosis not present

## 2020-12-29 DIAGNOSIS — Z1331 Encounter for screening for depression: Secondary | ICD-10-CM | POA: Diagnosis not present

## 2020-12-29 DIAGNOSIS — Z6832 Body mass index (BMI) 32.0-32.9, adult: Secondary | ICD-10-CM | POA: Diagnosis not present

## 2020-12-29 DIAGNOSIS — Z0000 Encounter for general adult medical examination without abnormal findings: Secondary | ICD-10-CM | POA: Diagnosis not present

## 2020-12-29 DIAGNOSIS — E1165 Type 2 diabetes mellitus with hyperglycemia: Secondary | ICD-10-CM | POA: Diagnosis not present

## 2021-04-28 DIAGNOSIS — E1165 Type 2 diabetes mellitus with hyperglycemia: Secondary | ICD-10-CM | POA: Diagnosis not present

## 2021-04-28 DIAGNOSIS — E785 Hyperlipidemia, unspecified: Secondary | ICD-10-CM | POA: Diagnosis not present

## 2021-04-28 DIAGNOSIS — I10 Essential (primary) hypertension: Secondary | ICD-10-CM | POA: Diagnosis not present

## 2021-04-28 DIAGNOSIS — K219 Gastro-esophageal reflux disease without esophagitis: Secondary | ICD-10-CM | POA: Diagnosis not present

## 2021-05-21 DIAGNOSIS — M25562 Pain in left knee: Secondary | ICD-10-CM | POA: Diagnosis not present

## 2021-05-21 DIAGNOSIS — R208 Other disturbances of skin sensation: Secondary | ICD-10-CM | POA: Diagnosis not present

## 2021-05-21 DIAGNOSIS — M5459 Other low back pain: Secondary | ICD-10-CM | POA: Diagnosis not present

## 2021-05-21 DIAGNOSIS — M25561 Pain in right knee: Secondary | ICD-10-CM | POA: Diagnosis not present

## 2021-05-27 DIAGNOSIS — R208 Other disturbances of skin sensation: Secondary | ICD-10-CM | POA: Diagnosis not present

## 2021-05-27 DIAGNOSIS — M5459 Other low back pain: Secondary | ICD-10-CM | POA: Diagnosis not present

## 2021-05-27 DIAGNOSIS — M25561 Pain in right knee: Secondary | ICD-10-CM | POA: Diagnosis not present

## 2021-05-27 DIAGNOSIS — M25562 Pain in left knee: Secondary | ICD-10-CM | POA: Diagnosis not present

## 2021-05-28 DIAGNOSIS — S91332A Puncture wound without foreign body, left foot, initial encounter: Secondary | ICD-10-CM | POA: Diagnosis not present

## 2021-05-28 DIAGNOSIS — Z23 Encounter for immunization: Secondary | ICD-10-CM | POA: Diagnosis not present

## 2021-06-03 DIAGNOSIS — M5459 Other low back pain: Secondary | ICD-10-CM | POA: Diagnosis not present

## 2021-06-03 DIAGNOSIS — M25561 Pain in right knee: Secondary | ICD-10-CM | POA: Diagnosis not present

## 2021-06-03 DIAGNOSIS — M25562 Pain in left knee: Secondary | ICD-10-CM | POA: Diagnosis not present

## 2021-06-03 DIAGNOSIS — R208 Other disturbances of skin sensation: Secondary | ICD-10-CM | POA: Diagnosis not present

## 2021-06-10 DIAGNOSIS — M5459 Other low back pain: Secondary | ICD-10-CM | POA: Diagnosis not present

## 2021-06-10 DIAGNOSIS — M25561 Pain in right knee: Secondary | ICD-10-CM | POA: Diagnosis not present

## 2021-06-10 DIAGNOSIS — M25562 Pain in left knee: Secondary | ICD-10-CM | POA: Diagnosis not present

## 2021-06-10 DIAGNOSIS — R208 Other disturbances of skin sensation: Secondary | ICD-10-CM | POA: Diagnosis not present

## 2021-06-18 DIAGNOSIS — M25561 Pain in right knee: Secondary | ICD-10-CM | POA: Diagnosis not present

## 2021-06-18 DIAGNOSIS — M25562 Pain in left knee: Secondary | ICD-10-CM | POA: Diagnosis not present

## 2021-06-18 DIAGNOSIS — M5459 Other low back pain: Secondary | ICD-10-CM | POA: Diagnosis not present

## 2021-06-18 DIAGNOSIS — R208 Other disturbances of skin sensation: Secondary | ICD-10-CM | POA: Diagnosis not present

## 2021-06-26 DIAGNOSIS — M25561 Pain in right knee: Secondary | ICD-10-CM | POA: Diagnosis not present

## 2021-06-26 DIAGNOSIS — M25562 Pain in left knee: Secondary | ICD-10-CM | POA: Diagnosis not present

## 2021-06-26 DIAGNOSIS — R208 Other disturbances of skin sensation: Secondary | ICD-10-CM | POA: Diagnosis not present

## 2021-06-26 DIAGNOSIS — M5459 Other low back pain: Secondary | ICD-10-CM | POA: Diagnosis not present

## 2021-08-05 DIAGNOSIS — D2239 Melanocytic nevi of other parts of face: Secondary | ICD-10-CM | POA: Diagnosis not present

## 2021-08-05 DIAGNOSIS — D1801 Hemangioma of skin and subcutaneous tissue: Secondary | ICD-10-CM | POA: Diagnosis not present

## 2021-08-05 DIAGNOSIS — D225 Melanocytic nevi of trunk: Secondary | ICD-10-CM | POA: Diagnosis not present

## 2021-08-24 DIAGNOSIS — B9789 Other viral agents as the cause of diseases classified elsewhere: Secondary | ICD-10-CM | POA: Diagnosis not present

## 2021-08-24 DIAGNOSIS — R1084 Generalized abdominal pain: Secondary | ICD-10-CM | POA: Diagnosis not present

## 2021-08-24 DIAGNOSIS — R0981 Nasal congestion: Secondary | ICD-10-CM | POA: Diagnosis not present

## 2021-08-24 DIAGNOSIS — Z20828 Contact with and (suspected) exposure to other viral communicable diseases: Secondary | ICD-10-CM | POA: Diagnosis not present

## 2021-08-27 DIAGNOSIS — I10 Essential (primary) hypertension: Secondary | ICD-10-CM | POA: Diagnosis not present

## 2021-08-27 DIAGNOSIS — K219 Gastro-esophageal reflux disease without esophagitis: Secondary | ICD-10-CM | POA: Diagnosis not present

## 2021-08-27 DIAGNOSIS — E1165 Type 2 diabetes mellitus with hyperglycemia: Secondary | ICD-10-CM | POA: Diagnosis not present

## 2021-08-27 DIAGNOSIS — E785 Hyperlipidemia, unspecified: Secondary | ICD-10-CM | POA: Diagnosis not present

## 2021-10-06 DIAGNOSIS — J029 Acute pharyngitis, unspecified: Secondary | ICD-10-CM | POA: Diagnosis not present

## 2021-10-06 DIAGNOSIS — R6889 Other general symptoms and signs: Secondary | ICD-10-CM | POA: Diagnosis not present

## 2021-10-07 DIAGNOSIS — Z20822 Contact with and (suspected) exposure to covid-19: Secondary | ICD-10-CM | POA: Diagnosis not present

## 2021-10-28 DIAGNOSIS — E119 Type 2 diabetes mellitus without complications: Secondary | ICD-10-CM | POA: Diagnosis not present

## 2021-12-22 DIAGNOSIS — R1032 Left lower quadrant pain: Secondary | ICD-10-CM | POA: Diagnosis not present

## 2021-12-22 DIAGNOSIS — Z6833 Body mass index (BMI) 33.0-33.9, adult: Secondary | ICD-10-CM | POA: Diagnosis not present

## 2021-12-22 DIAGNOSIS — K5900 Constipation, unspecified: Secondary | ICD-10-CM | POA: Diagnosis not present

## 2021-12-25 DIAGNOSIS — H6122 Impacted cerumen, left ear: Secondary | ICD-10-CM | POA: Diagnosis not present

## 2021-12-25 DIAGNOSIS — H902 Conductive hearing loss, unspecified: Secondary | ICD-10-CM | POA: Diagnosis not present

## 2021-12-28 DIAGNOSIS — Z0000 Encounter for general adult medical examination without abnormal findings: Secondary | ICD-10-CM | POA: Diagnosis not present

## 2021-12-28 DIAGNOSIS — Z6833 Body mass index (BMI) 33.0-33.9, adult: Secondary | ICD-10-CM | POA: Diagnosis not present

## 2021-12-28 DIAGNOSIS — E1165 Type 2 diabetes mellitus with hyperglycemia: Secondary | ICD-10-CM | POA: Diagnosis not present

## 2022-02-11 DIAGNOSIS — M25511 Pain in right shoulder: Secondary | ICD-10-CM | POA: Diagnosis not present

## 2022-02-11 DIAGNOSIS — M25512 Pain in left shoulder: Secondary | ICD-10-CM | POA: Diagnosis not present

## 2022-02-19 DIAGNOSIS — M25511 Pain in right shoulder: Secondary | ICD-10-CM | POA: Diagnosis not present

## 2022-03-08 DIAGNOSIS — M19019 Primary osteoarthritis, unspecified shoulder: Secondary | ICD-10-CM | POA: Diagnosis not present

## 2022-03-08 DIAGNOSIS — M25511 Pain in right shoulder: Secondary | ICD-10-CM | POA: Diagnosis not present

## 2022-03-08 DIAGNOSIS — M75121 Complete rotator cuff tear or rupture of right shoulder, not specified as traumatic: Secondary | ICD-10-CM | POA: Diagnosis not present

## 2022-03-18 DIAGNOSIS — M7541 Impingement syndrome of right shoulder: Secondary | ICD-10-CM | POA: Diagnosis not present

## 2022-03-18 DIAGNOSIS — M75121 Complete rotator cuff tear or rupture of right shoulder, not specified as traumatic: Secondary | ICD-10-CM | POA: Diagnosis not present

## 2022-03-18 DIAGNOSIS — S46111A Strain of muscle, fascia and tendon of long head of biceps, right arm, initial encounter: Secondary | ICD-10-CM | POA: Diagnosis not present

## 2022-03-18 DIAGNOSIS — M19011 Primary osteoarthritis, right shoulder: Secondary | ICD-10-CM | POA: Diagnosis not present

## 2022-03-18 DIAGNOSIS — M67813 Other specified disorders of tendon, right shoulder: Secondary | ICD-10-CM | POA: Diagnosis not present

## 2022-03-18 DIAGNOSIS — G8918 Other acute postprocedural pain: Secondary | ICD-10-CM | POA: Diagnosis not present

## 2022-03-18 DIAGNOSIS — X58XXXA Exposure to other specified factors, initial encounter: Secondary | ICD-10-CM | POA: Diagnosis not present

## 2022-03-18 DIAGNOSIS — Y999 Unspecified external cause status: Secondary | ICD-10-CM | POA: Diagnosis not present

## 2022-03-25 DIAGNOSIS — M25511 Pain in right shoulder: Secondary | ICD-10-CM | POA: Diagnosis not present

## 2022-03-25 DIAGNOSIS — M25611 Stiffness of right shoulder, not elsewhere classified: Secondary | ICD-10-CM | POA: Diagnosis not present

## 2022-03-29 DIAGNOSIS — M25611 Stiffness of right shoulder, not elsewhere classified: Secondary | ICD-10-CM | POA: Diagnosis not present

## 2022-03-29 DIAGNOSIS — M25511 Pain in right shoulder: Secondary | ICD-10-CM | POA: Diagnosis not present

## 2022-03-31 DIAGNOSIS — Z4789 Encounter for other orthopedic aftercare: Secondary | ICD-10-CM | POA: Diagnosis not present

## 2022-04-01 DIAGNOSIS — M25511 Pain in right shoulder: Secondary | ICD-10-CM | POA: Diagnosis not present

## 2022-04-01 DIAGNOSIS — M25611 Stiffness of right shoulder, not elsewhere classified: Secondary | ICD-10-CM | POA: Diagnosis not present

## 2022-04-07 DIAGNOSIS — M25511 Pain in right shoulder: Secondary | ICD-10-CM | POA: Diagnosis not present

## 2022-04-07 DIAGNOSIS — M25611 Stiffness of right shoulder, not elsewhere classified: Secondary | ICD-10-CM | POA: Diagnosis not present

## 2022-04-09 DIAGNOSIS — M25511 Pain in right shoulder: Secondary | ICD-10-CM | POA: Diagnosis not present

## 2022-04-09 DIAGNOSIS — M25611 Stiffness of right shoulder, not elsewhere classified: Secondary | ICD-10-CM | POA: Diagnosis not present

## 2022-04-13 DIAGNOSIS — M25611 Stiffness of right shoulder, not elsewhere classified: Secondary | ICD-10-CM | POA: Diagnosis not present

## 2022-04-13 DIAGNOSIS — M25511 Pain in right shoulder: Secondary | ICD-10-CM | POA: Diagnosis not present

## 2022-04-15 DIAGNOSIS — M25611 Stiffness of right shoulder, not elsewhere classified: Secondary | ICD-10-CM | POA: Diagnosis not present

## 2022-04-15 DIAGNOSIS — M25511 Pain in right shoulder: Secondary | ICD-10-CM | POA: Diagnosis not present

## 2022-04-19 DIAGNOSIS — M25511 Pain in right shoulder: Secondary | ICD-10-CM | POA: Diagnosis not present

## 2022-04-19 DIAGNOSIS — M25611 Stiffness of right shoulder, not elsewhere classified: Secondary | ICD-10-CM | POA: Diagnosis not present

## 2022-04-22 DIAGNOSIS — M25611 Stiffness of right shoulder, not elsewhere classified: Secondary | ICD-10-CM | POA: Diagnosis not present

## 2022-04-22 DIAGNOSIS — M25511 Pain in right shoulder: Secondary | ICD-10-CM | POA: Diagnosis not present

## 2022-04-26 DIAGNOSIS — M25511 Pain in right shoulder: Secondary | ICD-10-CM | POA: Diagnosis not present

## 2022-04-26 DIAGNOSIS — M25611 Stiffness of right shoulder, not elsewhere classified: Secondary | ICD-10-CM | POA: Diagnosis not present

## 2022-04-29 DIAGNOSIS — M25511 Pain in right shoulder: Secondary | ICD-10-CM | POA: Diagnosis not present

## 2022-04-29 DIAGNOSIS — M25611 Stiffness of right shoulder, not elsewhere classified: Secondary | ICD-10-CM | POA: Diagnosis not present

## 2022-05-03 DIAGNOSIS — M25611 Stiffness of right shoulder, not elsewhere classified: Secondary | ICD-10-CM | POA: Diagnosis not present

## 2022-05-03 DIAGNOSIS — M25511 Pain in right shoulder: Secondary | ICD-10-CM | POA: Diagnosis not present

## 2022-05-06 DIAGNOSIS — M25511 Pain in right shoulder: Secondary | ICD-10-CM | POA: Diagnosis not present

## 2022-05-06 DIAGNOSIS — M25611 Stiffness of right shoulder, not elsewhere classified: Secondary | ICD-10-CM | POA: Diagnosis not present

## 2022-05-10 DIAGNOSIS — M25511 Pain in right shoulder: Secondary | ICD-10-CM | POA: Diagnosis not present

## 2022-05-10 DIAGNOSIS — M25611 Stiffness of right shoulder, not elsewhere classified: Secondary | ICD-10-CM | POA: Diagnosis not present

## 2022-05-13 DIAGNOSIS — M25611 Stiffness of right shoulder, not elsewhere classified: Secondary | ICD-10-CM | POA: Diagnosis not present

## 2022-05-13 DIAGNOSIS — M25511 Pain in right shoulder: Secondary | ICD-10-CM | POA: Diagnosis not present

## 2022-05-17 DIAGNOSIS — M25611 Stiffness of right shoulder, not elsewhere classified: Secondary | ICD-10-CM | POA: Diagnosis not present

## 2022-05-17 DIAGNOSIS — M25511 Pain in right shoulder: Secondary | ICD-10-CM | POA: Diagnosis not present

## 2022-05-20 DIAGNOSIS — M25611 Stiffness of right shoulder, not elsewhere classified: Secondary | ICD-10-CM | POA: Diagnosis not present

## 2022-05-20 DIAGNOSIS — M25511 Pain in right shoulder: Secondary | ICD-10-CM | POA: Diagnosis not present

## 2022-05-24 DIAGNOSIS — M25611 Stiffness of right shoulder, not elsewhere classified: Secondary | ICD-10-CM | POA: Diagnosis not present

## 2022-05-24 DIAGNOSIS — M25511 Pain in right shoulder: Secondary | ICD-10-CM | POA: Diagnosis not present

## 2022-05-27 DIAGNOSIS — M25511 Pain in right shoulder: Secondary | ICD-10-CM | POA: Diagnosis not present

## 2022-05-27 DIAGNOSIS — M25611 Stiffness of right shoulder, not elsewhere classified: Secondary | ICD-10-CM | POA: Diagnosis not present

## 2022-05-31 DIAGNOSIS — M25611 Stiffness of right shoulder, not elsewhere classified: Secondary | ICD-10-CM | POA: Diagnosis not present

## 2022-05-31 DIAGNOSIS — M25511 Pain in right shoulder: Secondary | ICD-10-CM | POA: Diagnosis not present

## 2022-06-03 DIAGNOSIS — M25511 Pain in right shoulder: Secondary | ICD-10-CM | POA: Diagnosis not present

## 2022-06-03 DIAGNOSIS — M25611 Stiffness of right shoulder, not elsewhere classified: Secondary | ICD-10-CM | POA: Diagnosis not present

## 2022-06-07 DIAGNOSIS — M25611 Stiffness of right shoulder, not elsewhere classified: Secondary | ICD-10-CM | POA: Diagnosis not present

## 2022-06-07 DIAGNOSIS — M25511 Pain in right shoulder: Secondary | ICD-10-CM | POA: Diagnosis not present

## 2022-06-10 DIAGNOSIS — M25511 Pain in right shoulder: Secondary | ICD-10-CM | POA: Diagnosis not present

## 2022-06-10 DIAGNOSIS — M25611 Stiffness of right shoulder, not elsewhere classified: Secondary | ICD-10-CM | POA: Diagnosis not present

## 2022-06-15 DIAGNOSIS — M25611 Stiffness of right shoulder, not elsewhere classified: Secondary | ICD-10-CM | POA: Diagnosis not present

## 2022-06-15 DIAGNOSIS — M25511 Pain in right shoulder: Secondary | ICD-10-CM | POA: Diagnosis not present

## 2022-06-21 DIAGNOSIS — M25511 Pain in right shoulder: Secondary | ICD-10-CM | POA: Diagnosis not present

## 2022-06-21 DIAGNOSIS — M25611 Stiffness of right shoulder, not elsewhere classified: Secondary | ICD-10-CM | POA: Diagnosis not present

## 2022-06-28 DIAGNOSIS — E1165 Type 2 diabetes mellitus with hyperglycemia: Secondary | ICD-10-CM | POA: Diagnosis not present

## 2022-06-28 DIAGNOSIS — M25511 Pain in right shoulder: Secondary | ICD-10-CM | POA: Diagnosis not present

## 2022-06-28 DIAGNOSIS — K219 Gastro-esophageal reflux disease without esophagitis: Secondary | ICD-10-CM | POA: Diagnosis not present

## 2022-06-28 DIAGNOSIS — I10 Essential (primary) hypertension: Secondary | ICD-10-CM | POA: Diagnosis not present

## 2022-06-28 DIAGNOSIS — E785 Hyperlipidemia, unspecified: Secondary | ICD-10-CM | POA: Diagnosis not present

## 2022-06-28 DIAGNOSIS — M25611 Stiffness of right shoulder, not elsewhere classified: Secondary | ICD-10-CM | POA: Diagnosis not present

## 2022-07-05 DIAGNOSIS — M25511 Pain in right shoulder: Secondary | ICD-10-CM | POA: Diagnosis not present

## 2022-07-05 DIAGNOSIS — M25611 Stiffness of right shoulder, not elsewhere classified: Secondary | ICD-10-CM | POA: Diagnosis not present

## 2022-07-13 DIAGNOSIS — M25611 Stiffness of right shoulder, not elsewhere classified: Secondary | ICD-10-CM | POA: Diagnosis not present

## 2022-07-13 DIAGNOSIS — M25511 Pain in right shoulder: Secondary | ICD-10-CM | POA: Diagnosis not present

## 2022-07-19 DIAGNOSIS — M25511 Pain in right shoulder: Secondary | ICD-10-CM | POA: Diagnosis not present

## 2022-07-19 DIAGNOSIS — M25611 Stiffness of right shoulder, not elsewhere classified: Secondary | ICD-10-CM | POA: Diagnosis not present

## 2022-07-26 DIAGNOSIS — M25511 Pain in right shoulder: Secondary | ICD-10-CM | POA: Diagnosis not present

## 2022-07-26 DIAGNOSIS — M25611 Stiffness of right shoulder, not elsewhere classified: Secondary | ICD-10-CM | POA: Diagnosis not present

## 2022-07-28 DIAGNOSIS — Z4789 Encounter for other orthopedic aftercare: Secondary | ICD-10-CM | POA: Diagnosis not present

## 2022-08-04 DIAGNOSIS — L821 Other seborrheic keratosis: Secondary | ICD-10-CM | POA: Diagnosis not present

## 2022-08-04 DIAGNOSIS — D225 Melanocytic nevi of trunk: Secondary | ICD-10-CM | POA: Diagnosis not present

## 2022-08-04 DIAGNOSIS — L814 Other melanin hyperpigmentation: Secondary | ICD-10-CM | POA: Diagnosis not present

## 2022-08-04 DIAGNOSIS — D2239 Melanocytic nevi of other parts of face: Secondary | ICD-10-CM | POA: Diagnosis not present

## 2022-12-31 DIAGNOSIS — Z1331 Encounter for screening for depression: Secondary | ICD-10-CM | POA: Diagnosis not present

## 2022-12-31 DIAGNOSIS — E1165 Type 2 diabetes mellitus with hyperglycemia: Secondary | ICD-10-CM | POA: Diagnosis not present

## 2022-12-31 DIAGNOSIS — Z0000 Encounter for general adult medical examination without abnormal findings: Secondary | ICD-10-CM | POA: Diagnosis not present

## 2022-12-31 DIAGNOSIS — E785 Hyperlipidemia, unspecified: Secondary | ICD-10-CM | POA: Diagnosis not present

## 2023-01-28 DIAGNOSIS — Z86010 Personal history of colonic polyps: Secondary | ICD-10-CM | POA: Diagnosis not present

## 2023-01-28 DIAGNOSIS — Z09 Encounter for follow-up examination after completed treatment for conditions other than malignant neoplasm: Secondary | ICD-10-CM | POA: Diagnosis not present

## 2023-01-28 DIAGNOSIS — K635 Polyp of colon: Secondary | ICD-10-CM | POA: Diagnosis not present

## 2023-01-28 DIAGNOSIS — D127 Benign neoplasm of rectosigmoid junction: Secondary | ICD-10-CM | POA: Diagnosis not present

## 2023-03-25 DIAGNOSIS — D485 Neoplasm of uncertain behavior of skin: Secondary | ICD-10-CM | POA: Diagnosis not present

## 2023-07-05 DIAGNOSIS — E785 Hyperlipidemia, unspecified: Secondary | ICD-10-CM | POA: Diagnosis not present

## 2023-07-05 DIAGNOSIS — E1165 Type 2 diabetes mellitus with hyperglycemia: Secondary | ICD-10-CM | POA: Diagnosis not present

## 2023-07-05 DIAGNOSIS — Z0000 Encounter for general adult medical examination without abnormal findings: Secondary | ICD-10-CM | POA: Diagnosis not present

## 2023-07-05 DIAGNOSIS — I10 Essential (primary) hypertension: Secondary | ICD-10-CM | POA: Diagnosis not present

## 2023-07-05 DIAGNOSIS — Z125 Encounter for screening for malignant neoplasm of prostate: Secondary | ICD-10-CM | POA: Diagnosis not present

## 2023-07-05 DIAGNOSIS — N529 Male erectile dysfunction, unspecified: Secondary | ICD-10-CM | POA: Diagnosis not present

## 2023-07-12 DIAGNOSIS — L02811 Cutaneous abscess of head [any part, except face]: Secondary | ICD-10-CM | POA: Diagnosis not present

## 2023-08-04 DIAGNOSIS — L821 Other seborrheic keratosis: Secondary | ICD-10-CM | POA: Diagnosis not present

## 2023-08-04 DIAGNOSIS — D225 Melanocytic nevi of trunk: Secondary | ICD-10-CM | POA: Diagnosis not present

## 2023-08-04 DIAGNOSIS — D2239 Melanocytic nevi of other parts of face: Secondary | ICD-10-CM | POA: Diagnosis not present

## 2023-08-04 DIAGNOSIS — L7211 Pilar cyst: Secondary | ICD-10-CM | POA: Diagnosis not present

## 2023-10-01 DIAGNOSIS — H6123 Impacted cerumen, bilateral: Secondary | ICD-10-CM | POA: Diagnosis not present
